# Patient Record
Sex: Female | Born: 1940 | Race: White | Hispanic: No | Smoking: Never smoker
Health system: Southern US, Community
[De-identification: ages and names within clinical notes are randomized; demographics above are authoritative.]

## PROBLEM LIST (undated history)

## (undated) DIAGNOSIS — E119 Type 2 diabetes mellitus without complications: Secondary | ICD-10-CM

## (undated) DIAGNOSIS — E785 Hyperlipidemia, unspecified: Secondary | ICD-10-CM

## (undated) DIAGNOSIS — I10 Essential (primary) hypertension: Secondary | ICD-10-CM

## (undated) HISTORY — DX: Type 2 diabetes mellitus without complications: E11.9

## (undated) HISTORY — PX: ABDOMINAL HYSTERECTOMY: SHX81

## (undated) HISTORY — DX: Hyperlipidemia, unspecified: E78.5

## (undated) HISTORY — PX: COLONOSCOPY: SHX174

## (undated) HISTORY — DX: Essential (primary) hypertension: I10

## (undated) HISTORY — PX: TONSILLECTOMY: SUR1361

---

## 1998-05-14 HISTORY — PX: OTHER SURGICAL HISTORY: SHX169

## 2007-10-16 ENCOUNTER — Ambulatory Visit: Payer: Self-pay | Admitting: Internal Medicine

## 2012-07-22 ENCOUNTER — Ambulatory Visit: Payer: Self-pay | Admitting: Internal Medicine

## 2012-08-04 ENCOUNTER — Ambulatory Visit: Payer: Self-pay | Admitting: Internal Medicine

## 2013-12-30 ENCOUNTER — Ambulatory Visit: Payer: Self-pay | Admitting: Internal Medicine

## 2014-03-01 ENCOUNTER — Encounter: Payer: Self-pay | Admitting: General Surgery

## 2014-03-07 ENCOUNTER — Emergency Department: Payer: Self-pay | Admitting: Emergency Medicine

## 2014-03-15 ENCOUNTER — Encounter: Payer: Self-pay | Admitting: General Surgery

## 2014-03-15 ENCOUNTER — Ambulatory Visit (INDEPENDENT_AMBULATORY_CARE_PROVIDER_SITE_OTHER): Payer: Medicare Other | Admitting: General Surgery

## 2014-03-15 VITALS — BP 130/74 | HR 72 | Resp 14 | Ht 65.0 in | Wt 216.0 lb

## 2014-03-15 DIAGNOSIS — Z1211 Encounter for screening for malignant neoplasm of colon: Secondary | ICD-10-CM

## 2014-03-15 NOTE — Progress Notes (Addendum)
Patient ID: Carolyn Gillespie, female   DOB: May 02, 1941, 73 y.o.   MRN: [id number]  Chief Complaint  Patient presents with  . Other    screening colonoscopy    HPI Carolyn Gillespie is a 73 y.o. female here today for a evalution of a colonoscopy . Patient states she is having no GI problems at this time. Patient states she had a colonoscopy 20 years ago. No family history of colon cancers.  HPI  Past Medical History  Diagnosis Date  . Diabetes mellitus without complication   . Hypertension   . Hyperlipidemia     Past Surgical History  Procedure Laterality Date  . Tonsillectomy    . Bladder tack  2000  . Colonoscopy      20 years ago  . Abdominal hysterectomy      1970's    No family history on file.  Social History History  Substance Use Topics  . Smoking status: Never Smoker   . Smokeless tobacco: Not on file  . Alcohol Use: No    No Known Allergies  Current Outpatient Prescriptions  Medication Sig Dispense Refill  . amLODipine (NORVASC) 5 MG tablet Take 5 mg by mouth daily.     Marland Kitchen glimepiride (AMARYL) 2 MG tablet Take 2 mg by mouth daily with breakfast.     . ibuprofen (ADVIL,MOTRIN) 800 MG tablet 800 mg every 8 (eight) hours as needed.     . Liraglutide (VICTOZA) 18 MG/3ML SOPN Inject 1.2 mg into the skin 1 day or 1 dose.    Marland Kitchen LORazepam (ATIVAN) 0.5 MG tablet Take 0.5 mg by mouth 2 (two) times daily.     . metFORMIN (GLUCOPHAGE) 1000 MG tablet Take 1,000 mg by mouth 2 (two) times daily with a meal.     . pioglitazone (ACTOS) 30 MG tablet Take 30 mg by mouth daily.    . pravastatin (PRAVACHOL) 40 MG tablet Take 40 mg by mouth daily.     . traMADol (ULTRAM) 50 MG tablet Take 50 mg by mouth every 12 (twelve) hours as needed.   0  . polyethylene glycol powder (MIRALAX) powder Take 1 Container by mouth once. 255 grams one bottle for colonoscopy prep     No current facility-administered medications for this visit.    Review of Systems Review of Systems  Constitutional:  Negative.   Respiratory: Negative.   Cardiovascular: Negative.     Blood pressure 130/74, pulse 72, resp. rate 14, height 5\' 5"  (1.651 m), weight 216 lb (97.977 kg).  Physical Exam Physical Exam  Constitutional: She is oriented to person, place, and time. She appears well-developed and well-nourished.  Cardiovascular: Normal rate, regular rhythm and normal heart sounds.   Pulmonary/Chest: Effort normal and breath sounds normal.  Neurological: She is alert and oriented to person, place, and time.  Skin: Skin is dry.    Data Reviewed None  Assessment    Candidate for screening colonoscopy.      Plan    Discussed colonoscopy risks and benefits.  Bleeding and perforation reviewed.     Patient has been scheduled for a colonoscopy on 03-30-14 at [facility]. Patient has been asked to hold Metformin, Actos, and glimepiride the day before procedure.   PCP:  Irven Easterly 03/17/2014, 7:56 AM

## 2014-03-15 NOTE — Patient Instructions (Addendum)
Colonoscopy A colonoscopy is an exam to look at the entire large intestine (colon). This exam can help find problems such as tumors, polyps, inflammation, and areas of bleeding. The exam takes about 1 hour.  LET [facility] CARE PROVIDER KNOW ABOUT:   Any allergies you have.  All medicines you are taking, including vitamins, herbs, eye drops, creams, and over-the-counter medicines.  Previous problems you or members of your family have had with the use of anesthetics.  Any blood disorders you have.  Previous surgeries you have had.  Medical conditions you have. RISKS AND COMPLICATIONS  Generally, this is a safe procedure. However, as with any procedure, complications can occur. Possible complications include:  Bleeding.  Tearing or rupture of the colon wall.  Reaction to medicines given during the exam.  Infection (rare). BEFORE THE PROCEDURE   Ask your health care provider about changing or stopping your regular medicines.  You may be prescribed an oral bowel prep. This involves drinking a large amount of medicated liquid, starting the day before your procedure. The liquid will cause you to have multiple loose stools until your stool is almost clear or light green. This cleans out your colon in preparation for the procedure.  Do not eat or drink anything else once you have started the bowel prep, unless your health care provider tells you it is safe to do so.  Arrange for someone to drive you home after the procedure. PROCEDURE   You will be given medicine to help you relax (sedative).  You will lie on your side with your knees bent.  A long, flexible tube with a light and camera on the end (colonoscope) will be inserted through the rectum and into the colon. The camera sends video back to a computer screen as it moves through the colon. The colonoscope also releases carbon dioxide gas to inflate the colon. This helps your health care provider see the area better.  During  the exam, your health care provider may take a small tissue sample (biopsy) to be examined under a microscope if any abnormalities are found.  The exam is finished when the entire colon has been viewed. AFTER THE PROCEDURE   Do not drive for 24 hours after the exam.  You may have a small amount of blood in your stool.  You may pass moderate amounts of gas and have mild abdominal cramping or bloating. This is caused by the gas used to inflate your colon during the exam.  Ask when your test results will be ready and how you will get your results. Make sure you get your test results. Document Released: 04/27/2000 Document Revised: 02/18/2013 Document Reviewed: 01/05/2013 [facility] Patient Information 2015 [place], [place]. This information is not intended to replace advice given to you by your health care provider. Make sure you discuss any questions you have with your health care provider.  Patient has been scheduled for a colonoscopy on 03-30-14 at [facility]. Patient has been asked to hold Metformin, Actos, and glimepiride the day before procedure.

## 2014-03-16 ENCOUNTER — Other Ambulatory Visit: Payer: Self-pay | Admitting: General Surgery

## 2014-03-16 DIAGNOSIS — Z1211 Encounter for screening for malignant neoplasm of colon: Secondary | ICD-10-CM | POA: Insufficient documentation

## 2014-03-30 ENCOUNTER — Ambulatory Visit: Payer: Self-pay | Admitting: General Surgery

## 2014-03-30 DIAGNOSIS — D122 Benign neoplasm of ascending colon: Secondary | ICD-10-CM

## 2014-03-30 DIAGNOSIS — Z1211 Encounter for screening for malignant neoplasm of colon: Secondary | ICD-10-CM

## 2014-03-31 ENCOUNTER — Encounter: Payer: Self-pay | Admitting: General Surgery

## 2014-04-01 ENCOUNTER — Telehealth: Payer: Self-pay

## 2014-04-01 ENCOUNTER — Encounter: Payer: Self-pay | Admitting: General Surgery

## 2014-04-01 NOTE — Telephone Encounter (Signed)
-----   Message from Robert Bellow, MD sent at 04/01/2014  3:16 PM EST ----- Please notify the patient that the polyp removed on November 17 was benign, no evidence of cancer. He is a kind that can have brothers and sisters come and a follow-up exam in 3 years would be appropriate. Please put her in recalls. Thank you  ----- Message -----    From: Darrin Nipper, CMA    Sent: 04/01/2014  11:51 AM      To: Robert Bellow, MD

## 2014-04-01 NOTE — Telephone Encounter (Signed)
Notified patient as instructed, patient pleased. Discussed follow-up appointments, patient agrees  

## 2014-09-06 LAB — SURGICAL PATHOLOGY

## 2015-11-21 ENCOUNTER — Other Ambulatory Visit: Payer: Self-pay | Admitting: Internal Medicine

## 2015-11-21 DIAGNOSIS — Z1231 Encounter for screening mammogram for malignant neoplasm of breast: Secondary | ICD-10-CM

## 2016-01-09 ENCOUNTER — Ambulatory Visit: Payer: Self-pay

## 2016-01-17 ENCOUNTER — Other Ambulatory Visit: Payer: Self-pay | Admitting: Internal Medicine

## 2016-01-17 ENCOUNTER — Ambulatory Visit
Admission: RE | Admit: 2016-01-17 | Discharge: 2016-01-17 | Disposition: A | Discharge status: Home / Self Care | Payer: Medicare Other | Source: Ambulatory Visit | Attending: Internal Medicine | Admitting: Internal Medicine

## 2016-01-17 DIAGNOSIS — Z1231 Encounter for screening mammogram for malignant neoplasm of breast: Secondary | ICD-10-CM | POA: Insufficient documentation

## 2017-03-20 ENCOUNTER — Encounter: Payer: Self-pay | Admitting: *Deleted

## 2017-03-25 ENCOUNTER — Ambulatory Visit: Payer: Self-pay | Admitting: General Surgery

## 2017-06-06 ENCOUNTER — Ambulatory Visit
Admission: RE | Admit: 2017-06-06 | Discharge: 2017-06-06 | Disposition: A | Discharge status: Home / Self Care | Payer: Medicare Other | Source: Ambulatory Visit | Attending: Internal Medicine | Admitting: Internal Medicine

## 2017-06-06 ENCOUNTER — Other Ambulatory Visit: Payer: Self-pay | Admitting: Internal Medicine

## 2017-06-06 DIAGNOSIS — R05 Cough: Secondary | ICD-10-CM

## 2017-06-06 DIAGNOSIS — R059 Cough, unspecified: Secondary | ICD-10-CM

## 2017-08-23 ENCOUNTER — Encounter: Payer: Self-pay | Admitting: *Deleted

## 2017-10-10 ENCOUNTER — Encounter: Payer: Self-pay | Admitting: *Deleted

## 2017-10-17 ENCOUNTER — Ambulatory Visit: Payer: Medicare Other | Admitting: General Surgery

## 2018-04-17 ENCOUNTER — Ambulatory Visit: Payer: Medicare Other | Admitting: General Surgery

## 2018-09-02 ENCOUNTER — Ambulatory Visit: Payer: Medicare Other | Admitting: General Surgery

## 2018-11-06 ENCOUNTER — Ambulatory Visit: Payer: Medicare Other | Admitting: General Surgery

## 2018-11-13 ENCOUNTER — Ambulatory Visit: Payer: Medicare Other | Admitting: General Surgery

## 2018-11-20 ENCOUNTER — Encounter: Payer: Self-pay | Admitting: General Surgery

## 2018-11-20 ENCOUNTER — Ambulatory Visit (INDEPENDENT_AMBULATORY_CARE_PROVIDER_SITE_OTHER): Payer: Medicare Other | Admitting: General Surgery

## 2018-11-20 ENCOUNTER — Other Ambulatory Visit: Payer: Self-pay

## 2018-11-20 VITALS — BP 152/80 | HR 96 | Temp 97.2°F | Resp 16 | Ht 65.0 in | Wt 218.8 lb

## 2018-11-20 DIAGNOSIS — Z86010 Personal history of colonic polyps: Secondary | ICD-10-CM

## 2018-11-20 MED ORDER — POLYETHYLENE GLYCOL 3350 17 GM/SCOOP PO POWD: ORAL | 0 refills | Status: DC

## 2018-11-20 NOTE — Patient Instructions (Addendum)
We will call you to schedule the Colonoscopy.     Colonoscopy, Adult A colonoscopy is an exam to look at the entire large intestine. During the exam, a lubricated, flexible tube that has a camera on the end of it is inserted into the anus and then passed into the rectum, colon, and other parts of the large intestine. You may have a colonoscopy as a part of normal colorectal screening or if you have certain symptoms, such as:  Lack of red blood cells (anemia).  Diarrhea that does not go away.  Abdominal pain.  Blood in your stool (feces). A colonoscopy can help screen for and diagnose medical problems, including:  Tumors.  Polyps.  Inflammation.  Areas of bleeding. Tell a health care provider about:  Any allergies you have.  All medicines you are taking, including vitamins, herbs, eye drops, creams, and over-the-counter medicines.  Any problems you or family members have had with anesthetic medicines.  Any blood disorders you have.  Any surgeries you have had.  Any medical conditions you have.  Any problems you have had passing stool. What are the risks? Generally, this is a safe procedure. However, problems may occur, including:  Bleeding.  A tear in the intestine.  A reaction to medicines given during the exam.  Infection (rare). What happens before the procedure? Eating and drinking restrictions Follow instructions from your health care provider about eating and drinking, which may include:  A few days before the procedure - follow a low-fiber diet. Avoid nuts, seeds, dried fruit, raw fruits, and vegetables.  1-3 days before the procedure - follow a clear liquid diet. Drink only clear liquids, such as clear broth or bouillon, black coffee or tea, clear juice, clear soft drinks or sports drinks, gelatin dessert, and popsicles. Avoid any liquids that contain red or purple dye.  On the day of the procedure - do not eat or drink anything starting 2 hours before  the procedure, or within the time period that your health care provider recommends. Up to 2 hours before the procedure, you may continue to drink clear liquids, such as water or clear fruit juice. Bowel prep If you were prescribed an oral bowel prep to clean out your colon:  Take it as told by your health care provider. Starting the day before your procedure, you will need to drink a large amount of medicated liquid. The liquid will cause you to have multiple loose stools until your stool is almost clear or light green.  If your skin or anus gets irritated from diarrhea, you may use these to relieve the irritation: ? Medicated wipes, such as adult wet wipes with aloe and vitamin E. ? A skin-soothing product like petroleum jelly.  If you vomit while drinking the bowel prep, take a break for up to 60 minutes and then begin the bowel prep again. If vomiting continues and you cannot take the bowel prep without vomiting, call your health care provider.  To clean out your colon, you may also be given: ? Laxative medicines. ? Instructions about how to use an enema. General instructions  Ask your health care provider about: ? Changing or stopping your regular medicines or supplements. This is especially important if you are taking iron supplements, diabetes medicines, or blood thinners. ? Taking medicines such as aspirin and ibuprofen. These medicines can thin your blood. Do not take these medicines before the procedure if your health care provider tells you not to.  Plan to have someone take  you home from the hospital or clinic. What happens during the procedure?   An IV may be inserted into one of your veins.  You will be given medicine to help you relax (sedative).  To reduce your risk of infection: ? Your health care team will wash or sanitize their hands. ? Your anal area will be washed with soap.  You will be asked to lie on your side with your knees bent.  Your health care provider  will lubricate a long, thin, flexible tube. The tube will have a camera and a light on the end.  The tube will be inserted into your anus.  The tube will be gently eased through your rectum and colon.  Air will be delivered into your colon to keep it open. You may feel some pressure or cramping.  The camera will be used to take images during the procedure.  A small tissue sample may be removed to be examined under a microscope (biopsy).  If small polyps are found, your health care provider may remove them and have them checked for cancer cells.  When the exam is done, the tube will be removed. The procedure may vary among health care providers and hospitals. What happens after the procedure?  Your blood pressure, heart rate, breathing rate, and blood oxygen level will be monitored until the medicines you were given have worn off.  Do not drive for 24 hours after the exam.  You may have a small amount of blood in your stool.  You may pass gas and have mild abdominal cramping or bloating due to the air that was used to inflate your colon during the exam.  It is up to you to get the results of your procedure. Ask your health care provider, or the department performing the procedure, when your results will be ready. Summary  A colonoscopy is an exam to look at the entire large intestine.  During a colonoscopy, a lubricated, flexible tube with a camera on the end of it is inserted into the anus and then passed into the colon and other parts of the large intestine.  Follow instructions from your health care provider about eating and drinking before the procedure.  If you were prescribed an oral bowel prep to clean out your colon, take it as told by your health care provider.  After your procedure, your blood pressure, heart rate, breathing rate, and blood oxygen level will be monitored until the medicines you were given have worn off. This information is not intended to replace advice  given to you by your health care provider. Make sure you discuss any questions you have with your health care provider. Document Released: 04/27/2000 Document Revised: 02/20/2017 Document Reviewed: 07/12/2015 Elsevier Patient Education  2020 [employer].

## 2018-11-20 NOTE — Progress Notes (Addendum)
Patient ID: Carolyn Gillespie, female   DOB: 1940/12/22, 78 y.o.   MRN: [id number]  Chief Complaint  Patient presents with  . Follow-up    Colonoscopy discussion    HPI Carolyn Gillespie is a 78 y.o. female.  Here today for Colonoscopy discussion. Last done 2015. Husband recently passed away. No blood in stool. Normal bowel movements.  HPI  Past Medical History:  Diagnosis Date  . Diabetes mellitus without complication ([place])   . Hyperlipidemia   . Hypertension     Past Surgical History:  Procedure Laterality Date  . ABDOMINAL HYSTERECTOMY     1970's  . bladder tack  2000  . COLONOSCOPY     20 years ago  . TONSILLECTOMY      History reviewed. No pertinent family history.  Social History Social History   Tobacco Use  . Smoking status: Never Smoker  . Smokeless tobacco: Never Used  Substance Use Topics  . Alcohol use: No    Alcohol/week: 0.0 standard drinks  . Drug use: No    No Known Allergies  Current Outpatient Medications  Medication Sig Dispense Refill  . amLODipine (NORVASC) 10 MG tablet Take 10 mg by mouth daily.    Marland Kitchen glimepiride (AMARYL) 2 MG tablet Take 2 mg by mouth daily with breakfast.     . ibuprofen (ADVIL,MOTRIN) 800 MG tablet 800 mg every 8 (eight) hours as needed.     . Liraglutide (VICTOZA) 18 MG/3ML SOPN Inject 1.2 mg into the skin 1 day or 1 dose.    Marland Kitchen LORazepam (ATIVAN) 0.5 MG tablet Take 0.5 mg by mouth 2 (two) times daily.     . metFORMIN (GLUCOPHAGE) 1000 MG tablet Take 1,000 mg by mouth 2 (two) times daily with a meal.     . pioglitazone (ACTOS) 30 MG tablet Take 30 mg by mouth daily.    . polyethylene glycol powder (MIRALAX) powder Take 1 Container by mouth once. 255 grams one bottle for colonoscopy prep    . pravastatin (PRAVACHOL) 40 MG tablet Take 40 mg by mouth daily.     . polyethylene glycol powder (GLYCOLAX/MIRALAX) 17 GM/SCOOP powder 255 grams one bottle for colonoscopy prep 255 g 0   No current facility-administered medications for  this visit.     Review of Systems Review of Systems  Constitutional: Negative.   Respiratory: Negative.   Cardiovascular: Negative.     Blood pressure (!) 152/80, pulse 96, temperature (!) 97.2 F (36.2 C), temperature source Temporal, resp. rate 16, height 5\' 5"  (1.651 m), weight 218 lb 12.8 oz (99.2 kg), SpO2 96 %.  Physical Exam Physical Exam Constitutional:      Appearance: Normal appearance.  Cardiovascular:     Rate and Rhythm: Normal rate and regular rhythm.  Pulmonary:     Effort: Pulmonary effort is normal.     Breath sounds: Normal breath sounds.  Skin:    General: Skin is warm and dry.  Neurological:     Mental Status: She is alert.     Data Reviewed March 30, 2014 colonoscopy:  DIAGNOSIS:  A. ASCENDING COLON POLYP; HOT SNARE:  - VILLOUS ADENOMA, MULTIPLE FRAGMENTS.  - NEGATIVE FOR HIGH-GRADE DYSPLASIA AND MALIGNANCY.   Assessment Candidate for repeat colonoscopy.  Plan Colonoscopy with possible biopsy/polypectomy prn: Information regarding the procedure, including its potential risks and complications (including but not limited to perforation of the bowel, which may require emergency surgery to repair, and bleeding) was verbally given to the patient. Educational information regarding  lower intestinal endoscopy was given to the patient. Written instructions for how to complete the bowel prep using Miralax were provided. The importance of drinking ample fluids to avoid dehydration as a result of the prep emphasized.   HPI, Physical Exam, Assessment and Plan have been scribed under the direction and in the presence of Robert Bellow, MD. Jonnie Finner, CMA  I have completed the exam and reviewed the above documentation for accuracy and completeness.  I agree with the above.  Haematologist has been used and any errors in dictation or transcription are unintentional.  Hervey Ard, M.D., F.A.C.S.  Forest Gleason Yuli Lanigan 11/20/2018, 7:50  PM   Patient has been scheduled for a colonoscopy on 12-17-18 at [facility]. Miralax prescription has been sent in to the patient's pharmacy today. Colonoscopy instructions have been reviewed with the patient. This patient has been asked to hold metformin day of colonoscopy prep and procedure. This patient is aware to call the office if they have further questions.   Dominga Ferry, CMA

## 2018-12-02 ENCOUNTER — Telehealth: Payer: Self-pay | Admitting: *Deleted

## 2018-12-02 NOTE — Telephone Encounter (Signed)
Patient was contacted today and notified that Dr. Bary Castilla is no longer with the practice.   I did inform patient that none of the other doctors at the office do colonoscopies. I did offer to make a referral to [facility] or the office of her choosing. Patient declines referral to [facility].  Patient states she liked Dr. Bary Castilla and doesn't know who she would go to.   I did encourage patient to address this with her primary care physician to see who he would recommend. Patient verbalizes understanding.

## 2018-12-12 ENCOUNTER — Other Ambulatory Visit: Payer: Medicare Other

## 2018-12-17 ENCOUNTER — Ambulatory Visit: Admit: 2018-12-17 | Payer: Medicare Other | Admitting: General Surgery

## 2018-12-17 SURGERY — COLONOSCOPY WITH PROPOFOL
Anesthesia: General

## 2018-12-19 ENCOUNTER — Other Ambulatory Visit: Payer: Self-pay | Admitting: Internal Medicine

## 2018-12-19 DIAGNOSIS — Z1231 Encounter for screening mammogram for malignant neoplasm of breast: Secondary | ICD-10-CM

## 2018-12-27 ENCOUNTER — Ambulatory Visit
Admission: EM | Admit: 2018-12-27 | Discharge: 2018-12-27 | Disposition: A | Discharge status: Home / Self Care | Payer: Medicare Other | Attending: Family Medicine | Admitting: Family Medicine

## 2018-12-27 ENCOUNTER — Encounter: Payer: Self-pay | Admitting: Emergency Medicine

## 2018-12-27 ENCOUNTER — Other Ambulatory Visit: Payer: Self-pay

## 2018-12-27 DIAGNOSIS — H1032 Unspecified acute conjunctivitis, left eye: Secondary | ICD-10-CM

## 2018-12-27 MED ORDER — MOXIFLOXACIN HCL 0.5 % OP SOLN: 1.0000 [drp] | Freq: Three times a day (TID) | OPHTHALMIC | 0 refills | Status: DC

## 2018-12-27 NOTE — ED Triage Notes (Signed)
Patient c/o redness, swelling and tenderness in her left eye that started yesterday.  Patient denies fevers.

## 2018-12-27 NOTE — Discharge Instructions (Signed)
Cool compresses to area Follow up with your eye doctor next week if symptoms not improving

## 2018-12-27 NOTE — ED Provider Notes (Signed)
[facility]    CSN: [id number] Arrival date & time: 12/27/18  0856     History   Chief Complaint Chief Complaint  Patient presents with  . Eye Problem    left  APPOINTMENT    HPI Carolyn Gillespie is a 78 y.o. female.   78 yo female with a c/o redness and drainage from her left eye since yesterday. States she woke up this morning with eye matted shut. Denies any injuries, foreign body sensation, fevers, chills.    Eye Problem   Past Medical History:  Diagnosis Date  . Diabetes mellitus without complication ([place])   . Hyperlipidemia   . Hypertension     Patient Active Problem List   Diagnosis Date Noted  . History of colonic polyps 11/20/2018    Past Surgical History:  Procedure Laterality Date  . ABDOMINAL HYSTERECTOMY     1970's  . bladder tack  2000  . COLONOSCOPY     20 years ago  . TONSILLECTOMY      OB History    Gravida  2   Para  2   Term      Preterm      AB      Living  2     SAB      TAB      Ectopic      Multiple      Live Births           Obstetric Comments  1st Menstrual Cycle:  15  1st Pregnancy:  19         Home Medications    Prior to Admission medications   Medication Sig Start Date End Date Taking? Authorizing Provider  amLODipine (NORVASC) 10 MG tablet Take 10 mg by mouth daily.   Yes [provider]  glimepiride (AMARYL) 2 MG tablet Take 2 mg by mouth daily with breakfast.  02/03/14  Yes [provider]  Liraglutide (VICTOZA) 18 MG/3ML SOPN Inject 1.2 mg into the skin 1 day or 1 dose.   Yes [provider]  LORazepam (ATIVAN) 0.5 MG tablet Take 0.5 mg by mouth 2 (two) times daily.  02/03/14  Yes [provider]  metFORMIN (GLUCOPHAGE) 1000 MG tablet Take 1,000 mg by mouth 2 (two) times daily with a meal.  02/03/14  Yes [provider]  pioglitazone (ACTOS) 30 MG tablet Take 30 mg by mouth daily.   Yes [provider]  polyethylene glycol powder  (MIRALAX) powder Take 1 Container by mouth once. 255 grams one bottle for colonoscopy prep   Yes [provider]  pravastatin (PRAVACHOL) 40 MG tablet Take 40 mg by mouth daily.  02/03/14  Yes [provider]  ibuprofen (ADVIL,MOTRIN) 800 MG tablet 800 mg every 8 (eight) hours as needed.  02/03/14   [provider]  moxifloxacin (VIGAMOX) 0.5 % ophthalmic solution Place 1 drop into the left eye 3 (three) times daily. 12/27/18   Norval Gable, MD  polyethylene glycol powder (GLYCOLAX/MIRALAX) 17 GM/SCOOP powder 255 grams one bottle for colonoscopy prep 11/20/18   Byrnett, Forest Gleason, MD    Family History Family History  Problem Relation Age of Onset  . Diabetes Mother     Social History Social History   Tobacco Use  . Smoking status: Never Smoker  . Smokeless tobacco: Never Used  Substance Use Topics  . Alcohol use: No    Alcohol/week: 0.0 standard drinks  . Drug use: No  Allergies   Patient has no known allergies.   Review of Systems Review of Systems   Physical Exam Triage Vital Signs ED Triage Vitals  Enc Vitals Group     BP 12/27/18 0913 (!) 173/75     Pulse Rate 12/27/18 0913 91     Resp 12/27/18 0913 16     Temp 12/27/18 0913 98.3 F (36.8 C)     Temp Source 12/27/18 0913 Oral     SpO2 12/27/18 0913 98 %     Weight 12/27/18 0909 214 lb (97.1 kg)     Height 12/27/18 0909 5\' 5"  (1.651 m)     Head Circumference --      Peak Flow --      Pain Score 12/27/18 0909 5     Pain Loc --      Pain Edu? --      Excl. in [place]? --    No data found.  Updated Vital Signs BP (!) 168/98 (BP Location: Right Arm)   Pulse 91   Temp 98.3 F (36.8 C) (Oral)   Resp 16   Ht 5\' 5"  (1.651 m)   Wt 97.1 kg   SpO2 98%   BMI 35.61 kg/m   Visual Acuity Right Eye Distance: 20/25 corrected Left Eye Distance: 20/25 corrected Bilateral Distance: 20/25 corrected  Right Eye Near:   Left Eye Near:    Bilateral Near:     Physical Exam Vitals signs and  nursing note reviewed.  Constitutional:      General: She is not in acute distress.    Appearance: She is not toxic-appearing or diaphoretic.  Eyes:     General: Lids are normal.        Right eye: No foreign body.        Left eye: No foreign body.     Conjunctiva/sclera:     Left eye: Left conjunctiva is injected. Exudate present.  Neurological:     Mental Status: She is alert.      UC Treatments / Results  Labs (all labs ordered are listed, but only abnormal results are displayed) Labs Reviewed - No data to display  EKG   Radiology No results found.  Procedures Procedures (including critical care time)  Medications Ordered in UC Medications - No data to display  Initial Impression / Assessment and Plan / UC Course  I have reviewed the triage vital signs and the nursing notes.  Pertinent labs & imaging results that were available during my care of the patient were reviewed by me and considered in my medical decision making (see chart for details).      Final Clinical Impressions(s) / UC Diagnoses   Final diagnoses:  Acute conjunctivitis of left eye, unspecified acute conjunctivitis type     Discharge Instructions     Cool compresses to area Follow up with your eye doctor next week if symptoms not improving    ED Prescriptions    Medication Sig Dispense Auth. Provider   moxifloxacin (VIGAMOX) 0.5 % ophthalmic solution Place 1 drop into the left eye 3 (three) times daily. 3 mL Norval Gable, MD      1. diagnosis reviewed with patient 2. rx as per orders above; reviewed possible side effects, interactions, risks and benefits  3. Recommend supportive treatment as above 4. Follow-up prn if symptoms worsen or don't improve  Controlled Substance Prescriptions [place] Controlled Substance Registry consulted? Not Applicable   Norval Gable, MD 12/27/18 1407

## 2019-01-27 ENCOUNTER — Ambulatory Visit
Admission: RE | Admit: 2019-01-27 | Discharge: 2019-01-27 | Disposition: A | Discharge status: Home / Self Care | Payer: Medicare Other | Source: Ambulatory Visit | Attending: Internal Medicine | Admitting: Internal Medicine

## 2019-01-27 DIAGNOSIS — Z1231 Encounter for screening mammogram for malignant neoplasm of breast: Secondary | ICD-10-CM

## 2020-02-25 ENCOUNTER — Other Ambulatory Visit: Payer: Self-pay | Admitting: Internal Medicine

## 2020-02-25 DIAGNOSIS — Z1231 Encounter for screening mammogram for malignant neoplasm of breast: Secondary | ICD-10-CM

## 2020-03-08 ENCOUNTER — Ambulatory Visit
Admission: RE | Admit: 2020-03-08 | Discharge: 2020-03-08 | Disposition: A | Discharge status: Home / Self Care | Payer: Medicare Other | Source: Ambulatory Visit | Attending: Internal Medicine | Admitting: Internal Medicine

## 2020-03-08 ENCOUNTER — Other Ambulatory Visit: Payer: Self-pay

## 2020-03-08 DIAGNOSIS — Z1231 Encounter for screening mammogram for malignant neoplasm of breast: Secondary | ICD-10-CM | POA: Diagnosis not present

## 2021-07-17 IMAGING — MG DIGITAL SCREENING BILAT W/ TOMO W/ CAD
6 of 10 series · 6 of 30 positions shown · non-contrast
Comparison: Previous exam(s).

CLINICAL DATA: Screening.

EXAM:
DIGITAL SCREENING BILATERAL MAMMOGRAM WITH TOMO AND CAD

[L CC synth-2D]
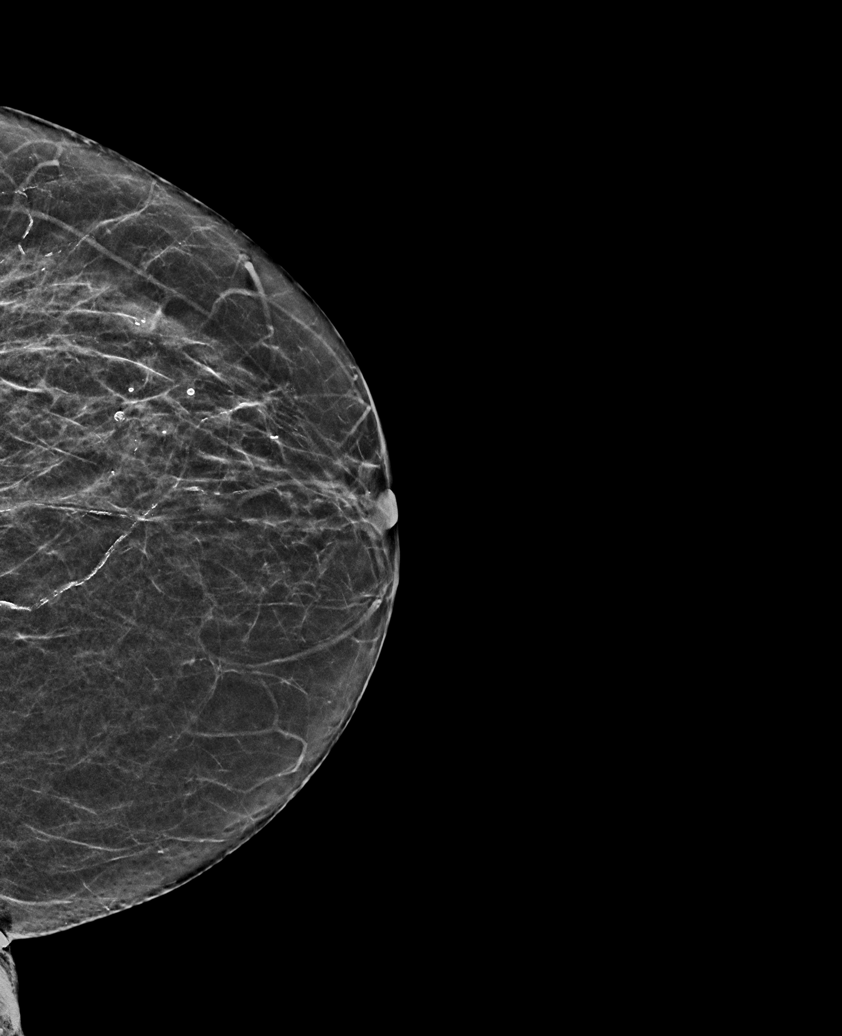

[R MLO synth-2D]
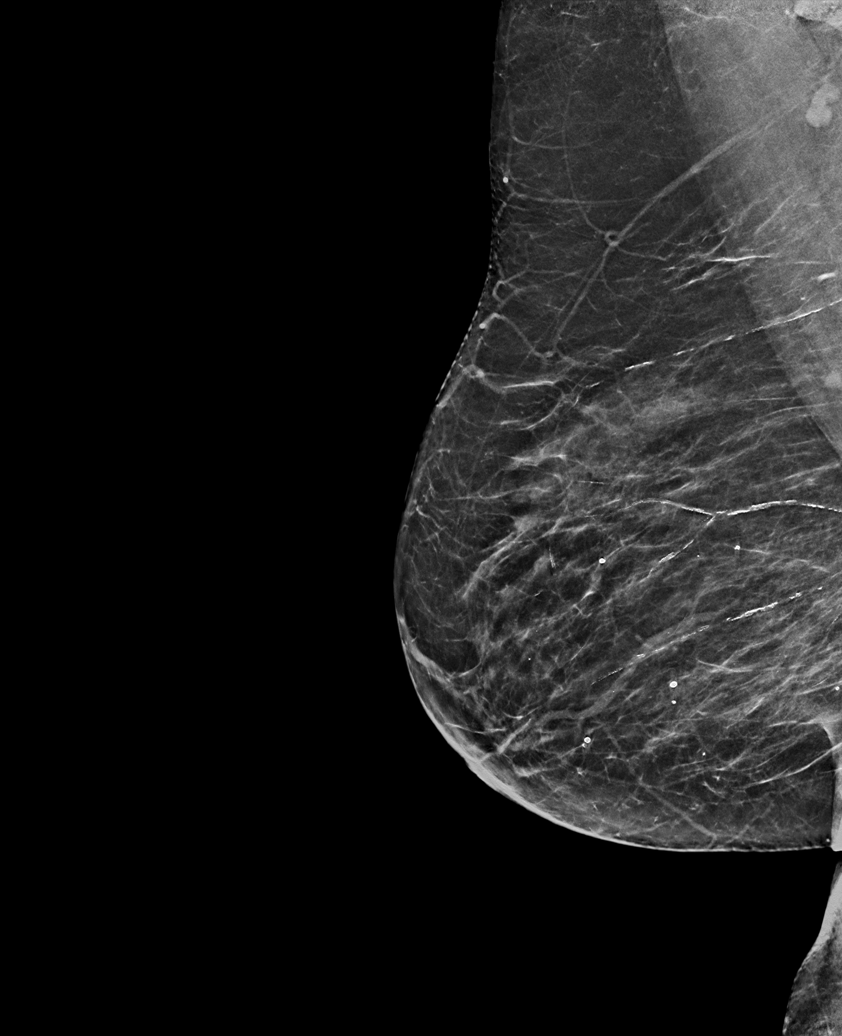

[L MLO synth-2D (1 of 2)]
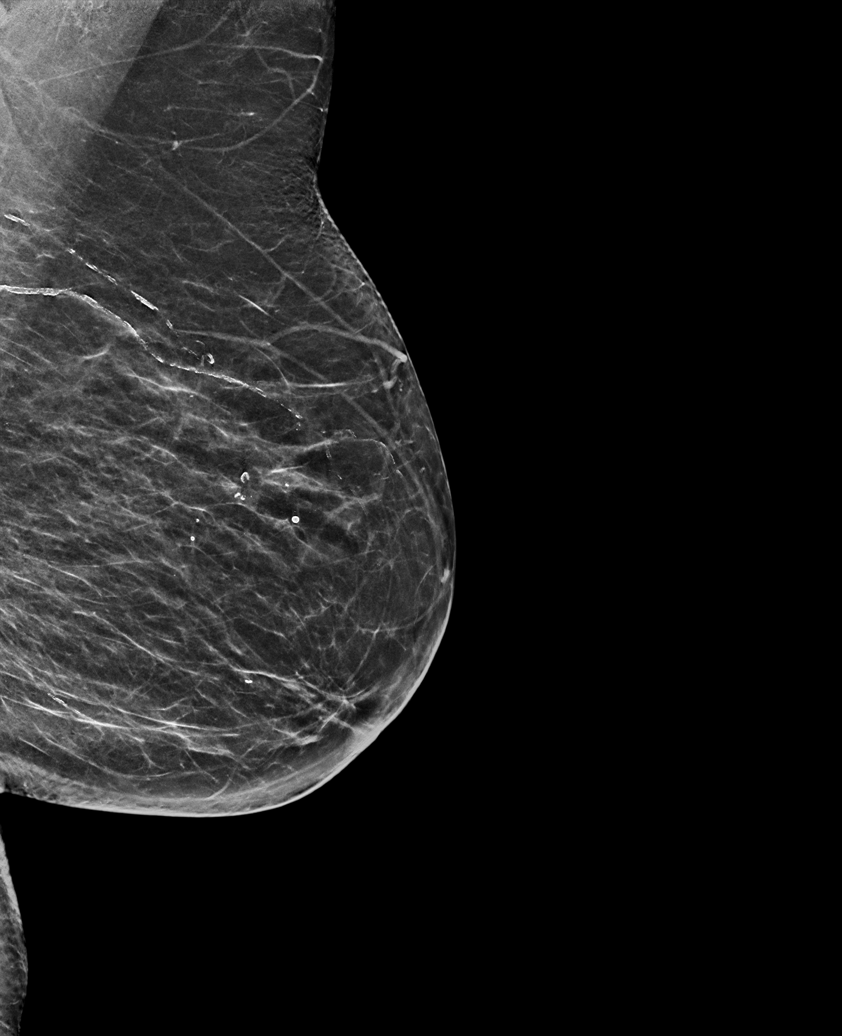

[L MLO synth-2D (2 of 2)]
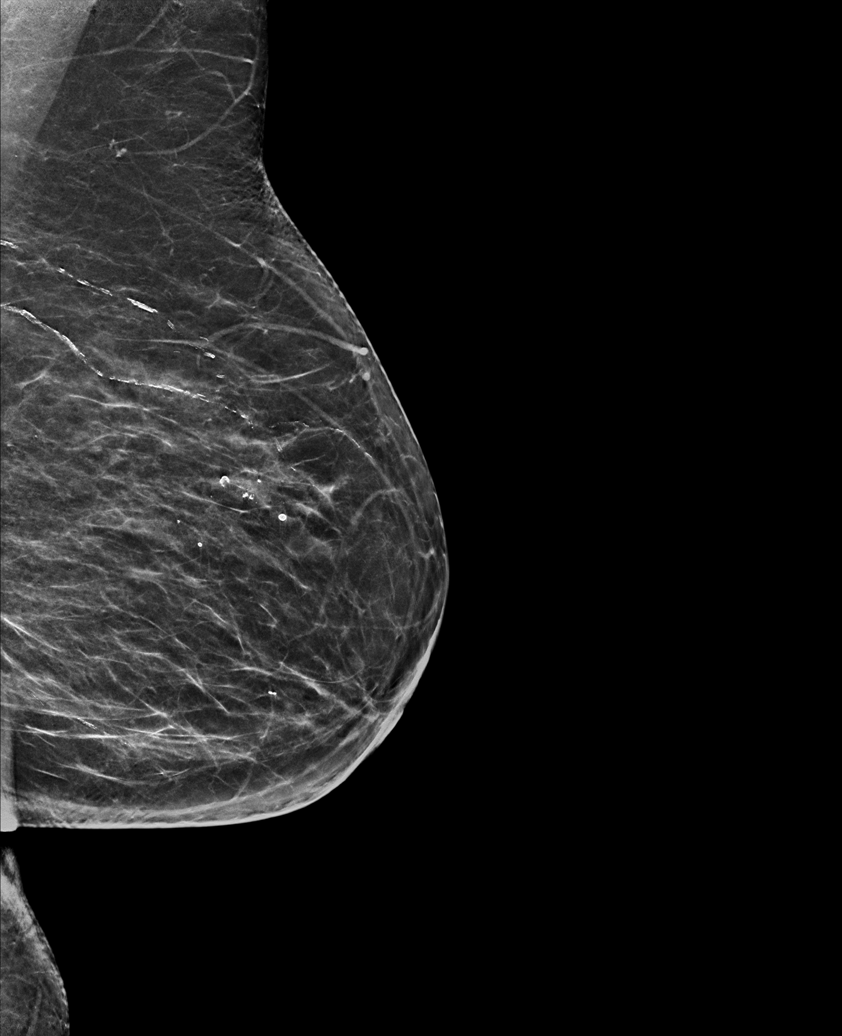

[R CC synth-2D]
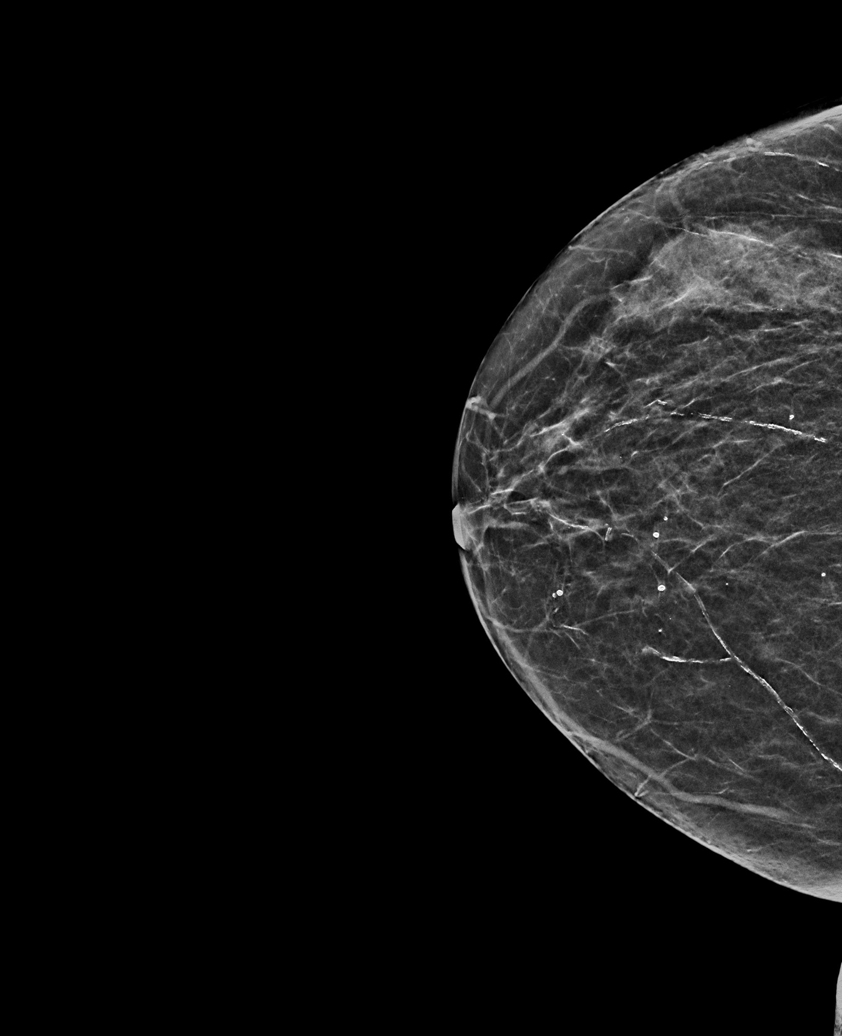

[L CC tomo · tomo slice 25/50.0]
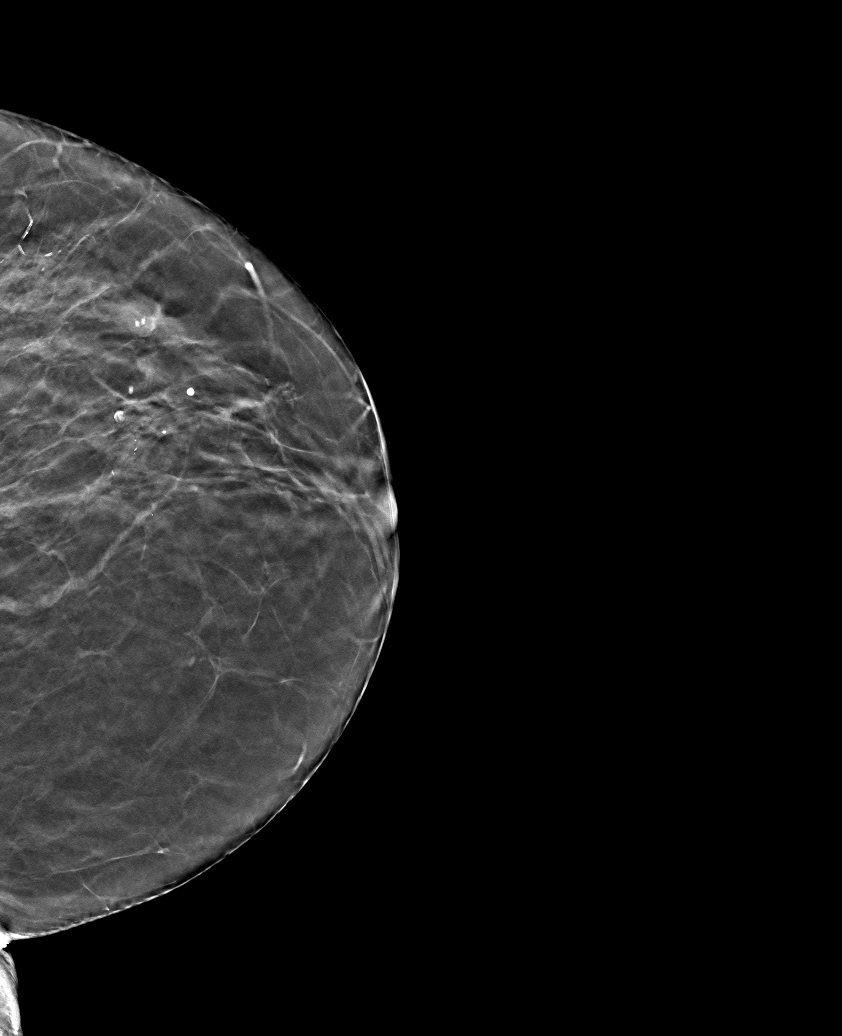

[6 of 30 positions shown; findings below may reference images not displayed]

ACR Breast Density Category b: There are scattered areas of
fibroglandular density.
FINDINGS: There are no findings suspicious for malignancy. Images were
processed with CAD.
IMPRESSION: No mammographic evidence of malignancy. A result letter of this
screening mammogram will be mailed directly to the patient.

RECOMMENDATION:
Screening mammogram in one year. (Code:[id number])

BI-RADS CATEGORY  1: Negative.

## 2021-09-07 ENCOUNTER — Ambulatory Visit
Admission: RE | Admit: 2021-09-07 | Discharge: 2021-09-07 | Disposition: A | Discharge status: Home / Self Care | Payer: Medicare Other | Source: Ambulatory Visit | Attending: Family | Admitting: Family

## 2021-09-07 VITALS — BP 171/75 | HR 77 | Temp 98.3°F | Resp 16 | Ht 65.0 in | Wt 214.1 lb

## 2021-09-07 DIAGNOSIS — R051 Acute cough: Secondary | ICD-10-CM | POA: Diagnosis not present

## 2021-09-07 DIAGNOSIS — J0140 Acute pansinusitis, unspecified: Secondary | ICD-10-CM | POA: Diagnosis not present

## 2021-09-07 DIAGNOSIS — H1033 Unspecified acute conjunctivitis, bilateral: Secondary | ICD-10-CM | POA: Diagnosis not present

## 2021-09-07 MED ORDER — AMOXICILLIN-POT CLAVULANATE 875-125 MG PO TABS: 1.0000 | ORAL_TABLET | Freq: Two times a day (BID) | ORAL | 0 refills | Status: AC

## 2021-09-07 MED ORDER — MOXIFLOXACIN HCL 0.5 % OP SOLN: 1.0000 [drp] | Freq: Three times a day (TID) | OPHTHALMIC | 0 refills | Status: AC

## 2021-09-07 NOTE — ED Triage Notes (Signed)
Pt c/o eye redness, swelling and drainage. Started about 2 days ago. Pt also c/o nasal congestion, cough, sputum production. Started about a week ago. Denies fever. Home covid test negative.  ?

## 2021-09-07 NOTE — ED Provider Notes (Signed)
?[place] ? ? ? ?CSN: [id number] ?Arrival date & time: 09/07/21  0807 ? ? ?  ? ?History   ?Chief Complaint ?Chief Complaint  ?Patient presents with  ? Eye Problem  ? Nasal Congestion  ? ? ?HPI ?Carolyn Gillespie is a 81 y.o. female.  ? ?81 year old female presents with nasal congestion, cough and sinus pressure for over 1 week. Was improving at first but now symptoms are worsening. Coughing up green mucus from sinus drainage. Denies any fever or GI symptoms. Took home COVID test which was negative. Also 2 days ago started experiencing bilateral eye redness and swelling of her eyelids with yellowish drainage. No pain but very irritated. Her grandchildren just had eye infection (probable pink eye) and other family members have had it the past few weeks. Has tried applying eye drops with minimal relief. Has also taken OTC cough medication for diabetics with minimal relief. Other chronic health issues include HTN, hyperlipidemia, thyroid disorder and type 2 DM. Currently on Norvasc, aspirin, Atorvastatin, Levothyroxine, Metformin, Actos, Glimepiride daily and Tulicity once weekly.  ? ?The history is provided by the patient.  ? ?Past Medical History:  ?Diagnosis Date  ? Diabetes mellitus without complication ([place])   ? Hyperlipidemia   ? Hypertension   ? ? ?Patient Active Problem List  ? Diagnosis Date Noted  ? History of colonic polyps 11/20/2018  ? ? ?Past Surgical History:  ?Procedure Laterality Date  ? ABDOMINAL HYSTERECTOMY    ? 1970's  ? bladder tack  2000  ? COLONOSCOPY    ? 20 years ago  ? TONSILLECTOMY    ? ? ?OB History   ? ? Gravida  ?2  ? Para  ?2  ? Term  ?   ? Preterm  ?   ? AB  ?   ? Living  ?2  ?  ? ? SAB  ?   ? IAB  ?   ? Ectopic  ?   ? Multiple  ?   ? Live Births  ?   ?   ?  ? Obstetric Comments  ?1st Menstrual Cycle:  15  ?1st Pregnancy:  19  ?  ? ?  ? ? ? ?Home Medications   ? ?Prior to Admission medications   ?Medication Sig Start Date End Date Taking? Authorizing Provider  ?amLODipine  (NORVASC) 10 MG tablet Take 10 mg by mouth daily.   Yes [provider]  ?amoxicillin-clavulanate (AUGMENTIN) 875-125 MG tablet Take 1 tablet by mouth every 12 (twelve) hours for 7 days. 09/07/21 09/14/21 Yes Freddi Schrager, Nicholes Stairs, NP  ?Dulaglutide (TRULICITY [place]) Inject into the skin.   Yes [provider]  ?glimepiride (AMARYL) 2 MG tablet Take 2 mg by mouth daily with breakfast.  02/03/14  Yes [provider]  ?ibuprofen (ADVIL,MOTRIN) 800 MG tablet 800 mg every 8 (eight) hours as needed.  02/03/14  Yes [provider]  ?LEVOTHYROXINE SODIUM PO Take by mouth.   Yes [provider]  ?LORazepam (ATIVAN) 0.5 MG tablet Take 0.5 mg by mouth 2 (two) times daily.  02/03/14  Yes [provider]  ?metFORMIN (GLUCOPHAGE) 1000 MG tablet Take 1,000 mg by mouth 2 (two) times daily with a meal.  02/03/14  Yes [provider]  ?pioglitazone (ACTOS) 30 MG tablet Take 30 mg by mouth daily.   Yes [provider]  ?polyethylene glycol powder (GLYCOLAX/MIRALAX) 17 GM/SCOOP powder Take 1 Container by mouth once. 255 grams one bottle for colonoscopy prep  Yes [provider]  ?pravastatin (PRAVACHOL) 40 MG tablet Take 40 mg by mouth daily.  02/03/14  Yes [provider]  ?moxifloxacin (VIGAMOX) 0.5 % ophthalmic solution Place 1 drop into both eyes 3 (three) times daily. 09/07/21   Katy Apo, NP  ? ? ?Family History ?Family History  ?Problem Relation Age of Onset  ? Diabetes Mother   ? Breast cancer Neg Hx   ? ? ?Social History ?Social History  ? ?Tobacco Use  ? Smoking status: Never  ? Smokeless tobacco: Never  ?Vaping Use  ? Vaping Use: Never used  ?Substance Use Topics  ? Alcohol use: No  ?  Alcohol/week: 0.0 standard drinks  ? Drug use: No  ? ? ? ?Allergies   ?Patient has no known allergies. ? ? ?Review of Systems ?Review of Systems  ?Constitutional:  Positive for fatigue. Negative for activity change, appetite change, chills, diaphoresis and fever.   ?HENT:  Positive for congestion, facial swelling (eyelids), postnasal drip, sinus pressure, sinus pain and sore throat (irritated). Negative for ear discharge, ear pain, mouth sores, rhinorrhea and trouble swallowing.   ?Eyes:  Positive for discharge and redness. Negative for photophobia and pain.  ?Respiratory:  Positive for cough. Negative for chest tightness, shortness of breath and wheezing.   ?Gastrointestinal:  Negative for diarrhea, nausea and vomiting.  ?Musculoskeletal:  Negative for arthralgias, myalgias, neck pain and neck stiffness.  ?Skin:  Negative for color change and rash.  ?Allergic/Immunologic: Negative for environmental allergies, food allergies and immunocompromised state.  ?Neurological:  Positive for headaches (frontal sinus). Negative for dizziness, tremors, seizures, syncope, speech difficulty, weakness, light-headedness and numbness.  ?Hematological:  Negative for adenopathy. Does not bruise/bleed easily.  ? ? ?Physical Exam ?Triage Vital Signs ?ED Triage Vitals  ?Enc Vitals Group  ?   BP 09/07/21 0820 (!) 171/75  ?   Pulse Rate 09/07/21 0820 77  ?   Resp 09/07/21 0820 16  ?   Temp 09/07/21 0820 98.3 ?F (36.8 ?C)  ?   Temp Source 09/07/21 0820 Oral  ?   SpO2 09/07/21 0820 99 %  ?   Weight 09/07/21 0818 214 lb 1.1 oz (97.1 kg)  ?   Height 09/07/21 0818 '5\' 5"'$  (1.651 m)  ?   Head Circumference --   ?   Peak Flow --   ?   Pain Score 09/07/21 0817 6  ?   Pain Loc --   ?   Pain Edu? --   ?   Excl. in [place]? --   ? ?No data found. ? ?Updated Vital Signs ?BP (!) 171/75 (BP Location: Right Arm)   Pulse 77   Temp 98.3 ?F (36.8 ?C) (Oral)   Resp 16   Ht '5\' 5"'$  (1.651 m)   Wt 214 lb 1.1 oz (97.1 kg)   SpO2 99%   BMI 35.62 kg/m?  ? ?Visual Acuity ?Right Eye Distance:   ?Left Eye Distance:   ?Bilateral Distance:   ? ?Right Eye Near:   ?Left Eye Near:    ?Bilateral Near:    ? ?Physical Exam ?Vitals and nursing note reviewed.  ?Constitutional:   ?   General: She is awake. She is not in acute distress. ?    Appearance: She is well-developed and well-groomed. She is ill-appearing.  ?   Comments: She is sitting on the exam table in no acute distress but appears tired and ill.   ?HENT:  ?   Head: Normocephalic and atraumatic.  ?  Right Ear: Hearing, ear canal and external ear normal. No middle ear effusion. Tympanic membrane is bulging. Tympanic membrane is not injected or erythematous.  ?   Left Ear: Hearing, ear canal and external ear normal.  No middle ear effusion. Tympanic membrane is bulging. Tympanic membrane is not injected or erythematous.  ?   Nose: Congestion present.  ?   Right Sinus: Maxillary sinus tenderness and frontal sinus tenderness present.  ?   Left Sinus: Maxillary sinus tenderness and frontal sinus tenderness present.  ?Eyes:  ?   General: Vision grossly intact. No visual field deficit.    ?   Right eye: Discharge present.     ?   Left eye: Discharge present. ?   Extraocular Movements: Extraocular movements intact.  ?   Conjunctiva/sclera:  ?   Right eye: Right conjunctiva is injected. Chemosis and exudate present.  ?   Left eye: Left conjunctiva is injected. Chemosis and exudate present.  ?   Pupils: Pupils are equal, round, and reactive to light.  ?   Comments: Both upper and lower eyelids are red, swollen and slightly tender. Conjunctiva are both red, irritated with yellowish discharge.   ?Cardiovascular:  ?   Rate and Rhythm: Normal rate and regular rhythm.  ?   Heart sounds: Normal heart sounds. No murmur heard. ?Pulmonary:  ?   Effort: Pulmonary effort is normal. No respiratory distress.  ?   Breath sounds: Normal breath sounds and air entry. No decreased air movement. No decreased breath sounds, wheezing, rhonchi or rales.  ?Musculoskeletal:     ?   General: Normal range of motion.  ?   Cervical back: Normal range of motion and neck supple.  ?Lymphadenopathy:  ?   Cervical: No cervical adenopathy.  ?Skin: ?   General: Skin is warm and dry.  ?   Capillary Refill: Capillary refill takes less  than 2 seconds.  ?   Findings: No rash.  ?Neurological:  ?   General: No focal deficit present.  ?   Mental Status: She is alert and oriented to person, place, and time.  ?Psychiatric:     ?   Mood and Affect:

## 2021-09-07 NOTE — Discharge Instructions (Addendum)
Recommend start Augmentin '875mg'$  twice a day with food for 7 days. Continue to push fluids to help loosen up mucus in sinuses. May continue OTC cough medication as directed.  ?Start Vigamox eye drops- place 1 drop in each eye 3 times a day for 5 to 7 days. Wash hands frequently. Follow-up with your PCP in 3 to 4 days if not improving.  ?

## 2021-10-19 ENCOUNTER — Other Ambulatory Visit: Payer: Self-pay | Admitting: General Surgery

## 2021-10-19 NOTE — Progress Notes (Signed)
Subjective:     Patient ID: Carolyn Gillespie is a 81 y.o. female.   HPI   The following portions of the patient's history were reviewed and updated as appropriate.   This an established patient is here today for: office visit. The patient is here today to discuss having a colonoscopy. Patient was due in 2018-07-31 but had to put off due to Covid and multiple family deaths. Patient reports her last colonoscopy was done in 03-30-2014. The patient reports bowel movements once daily. She denies any rectal bleeding or mucus.    The patient's husband and 3 siblings have died since her last visit in 07-31-18.  Procedure at that time was postponed by insurance issues and then by other family responsibilities.        Chief Complaint  Patient presents with   Pre-op Exam      BP (!) 164/82   Pulse 75   Temp 36.8 C (98.2 F)   Ht 165.1 cm (5' 5" )   Wt 92.5 kg (204 lb)   SpO2 100%   BMI 33.95 kg/m        Past Medical History:  Diagnosis Date   Diabetes (CMS-HCC)     HLD (hyperlipidemia)     HTN (hypertension)             Past Surgical History:  Procedure Laterality Date   bladder tack   31-Jul-1998   COLONOSCOPY   03/30/2014    Dr Bary Castilla   ABDOMINAL HYSTERECTOMY       TONSILLECTOMY            OB History       Gravida  3   Para  3   Term      Preterm      AB      Living           SAB      IAB      Ectopic      Molar      Multiple      Live Births           Obstetric Comments  Age at first period 19 Age of first pregnancy 66               Social History          Socioeconomic History   Marital status: Widowed  Tobacco Use   Smoking status: Never   Smokeless tobacco: Never  Substance and Sexual Activity   Alcohol use: Not Currently   Drug use: Never        No Known Allergies   Current Medications        Current Outpatient Medications  Medication Sig Dispense Refill   amLODIPine (NORVASC) 10 MG tablet Take 10 mg by mouth once daily       aspirin  81 MG EC tablet Take 81 mg by mouth once daily       atorvastatin (LIPITOR) 40 MG tablet Take 40 mg by mouth every evening       dulaglutide (TRULICITY) 1.5 HQ/4.6 mL subcutaneous pen injector Inject 1.5 mg subcutaneously once a week       glimepiride (AMARYL) 2 MG tablet Take 2 mg by mouth 2 (two) times daily       levothyroxine (SYNTHROID) 50 MCG tablet Take 50 mcg by mouth once daily       metFORMIN (GLUCOPHAGE) 500 MG tablet Take 1,000 mg by mouth 2 (two) times daily  pioglitazone (ACTOS) 30 MG tablet Take 30 mg by mouth once daily        No current facility-administered medications for this visit.             Family History  Problem Relation Age of Onset   Diabetes Mother     Breast cancer Neg Hx          Labs and Radiology:    March 30, 2014 pathology:   A. ASCENDING COLON POLYP; HOT SNARE:  - VILLOUS ADENOMA, MULTIPLE FRAGMENTS.  - NEGATIVE FOR HIGH-GRADE DYSPLASIA AND MALIGNANCY.   COMMENT: Correlation with the clinical appearance is advised with regard  to completeness of removal.    GROSS DESCRIPTION:  A. Labeled: Hot snare polyp ascending colon  Tissue Fragment(s): Multiple  Measurement: Aggregate 1 x 0.4 x 0.2 cm    Procedure report:    8 mm  pedunculated polyp.    Oct 06, 2021 laboratory review:   Blood sugar 105, normal electrolytes, EGFR 92, creatinine 0.58.  Normal liver function studies.  Elevated serum albumin of 4.8.  Hemoglobin A1c 7.2.         Review of Systems  Constitutional:  Negative for chills and fever.  Respiratory:  Negative for cough.        Objective:   Physical Exam Exam conducted with a chaperone present.  Constitutional:      Appearance: Normal appearance.  Cardiovascular:     Rate and Rhythm: Normal rate and regular rhythm.     Pulses: Normal pulses.     Heart sounds: Normal heart sounds.  Pulmonary:     Effort: Pulmonary effort is normal.     Breath sounds: Normal breath sounds.  Musculoskeletal:      Cervical back: Neck supple.  Skin:    General: Skin is warm and dry.  Neurological:     Mental Status: She is alert and oriented to person, place, and time.  Psychiatric:        Mood and Affect: Mood normal.        Behavior: Behavior normal.         Assessment:     Candidate for repeat colonoscopy based on family history and prior villous adenoma of the ascending colon.    Plan:     Patient was instructed in regards to preparation by the staff.  Procedure scheduled for November 01, 2021.      This note is partially prepared by Ledell Noss, CMA acting as a scribe in the presence of Dr. Hervey Ard, MD.    The documentation recorded by the scribe accurately reflects the service I personally performed and the decisions made by me.    Robert Bellow, MD FACS

## 2021-11-01 ENCOUNTER — Ambulatory Visit: Payer: Medicare Other

## 2021-11-01 ENCOUNTER — Encounter
Admission: RE | Disposition: A | Discharge status: Home / Self Care | Payer: Self-pay | Source: Home / Self Care | Attending: General Surgery

## 2021-11-01 ENCOUNTER — Ambulatory Visit
Admission: RE | Admit: 2021-11-01 | Discharge: 2021-11-01 | Disposition: A | Discharge status: Home / Self Care | Payer: Medicare Other | Attending: General Surgery | Admitting: General Surgery

## 2021-11-01 ENCOUNTER — Encounter: Payer: Self-pay | Admitting: General Surgery

## 2021-11-01 DIAGNOSIS — Z1211 Encounter for screening for malignant neoplasm of colon: Secondary | ICD-10-CM | POA: Diagnosis present

## 2021-11-01 DIAGNOSIS — D123 Benign neoplasm of transverse colon: Secondary | ICD-10-CM | POA: Diagnosis not present

## 2021-11-01 DIAGNOSIS — E785 Hyperlipidemia, unspecified: Secondary | ICD-10-CM | POA: Diagnosis not present

## 2021-11-01 DIAGNOSIS — Z86010 Personal history of colonic polyps: Secondary | ICD-10-CM | POA: Insufficient documentation

## 2021-11-01 DIAGNOSIS — Z7984 Long term (current) use of oral hypoglycemic drugs: Secondary | ICD-10-CM | POA: Insufficient documentation

## 2021-11-01 DIAGNOSIS — I10 Essential (primary) hypertension: Secondary | ICD-10-CM | POA: Insufficient documentation

## 2021-11-01 DIAGNOSIS — E119 Type 2 diabetes mellitus without complications: Secondary | ICD-10-CM | POA: Diagnosis not present

## 2021-11-01 DIAGNOSIS — Z79899 Other long term (current) drug therapy: Secondary | ICD-10-CM | POA: Insufficient documentation

## 2021-11-01 DIAGNOSIS — Z800 Family history of malignant neoplasm of digestive organs: Secondary | ICD-10-CM | POA: Diagnosis not present

## 2021-11-01 LAB — GLUCOSE, CAPILLARY: Glucose-Capillary: 143 mg/dL — ABNORMAL HIGH (ref 70–99)

## 2021-11-01 SURGERY — COLONOSCOPY WITH PROPOFOL
Anesthesia: General

## 2021-11-01 MED ORDER — SODIUM CHLORIDE 0.9 % IV SOLN: INTRAVENOUS | Status: DC

## 2021-11-01 MED ORDER — PROPOFOL 500 MG/50ML IV EMUL
INTRAVENOUS | Status: DC | PRN
  Administered 2021-11-01: 150 ug/kg/min via INTRAVENOUS

## 2021-11-01 MED ORDER — LIDOCAINE HCL (PF) 2 % IJ SOLN
INTRAMUSCULAR | Status: AC
  Filled 2021-11-01: qty 10

## 2021-11-01 MED ORDER — PROPOFOL 500 MG/50ML IV EMUL
INTRAVENOUS | Status: AC
  Filled 2021-11-01: qty 100

## 2021-11-01 MED ORDER — PROPOFOL 10 MG/ML IV BOLUS
INTRAVENOUS | Status: DC | PRN
  Administered 2021-11-01: 40 mg via INTRAVENOUS
  Administered 2021-11-01: 60 mg via INTRAVENOUS

## 2021-11-01 MED ORDER — LIDOCAINE HCL (CARDIAC) PF 100 MG/5ML IV SOSY
PREFILLED_SYRINGE | INTRAVENOUS | Status: DC | PRN
  Administered 2021-11-01: 50 mg via INTRAVENOUS

## 2021-11-01 NOTE — H&P (Signed)
Carolyn Gillespie [id number] 02/16/1941     HPI:  Villous adenoma of the ascending colon in 2015. For follow up exam. Tolerated the prep with minimal nausea, no vomiting.   Medications Prior to Admission  Medication Sig Dispense Refill Last Dose   amLODipine (NORVASC) 10 MG tablet Take 10 mg by mouth daily.   10/31/2021   Dulaglutide (TRULICITY [place]) Inject into the skin.   10/29/2021   glimepiride (AMARYL) 2 MG tablet Take 2 mg by mouth daily with breakfast.    10/31/2021   pravastatin (PRAVACHOL) 40 MG tablet Take 40 mg by mouth daily.    10/31/2021   aspirin EC 81 MG tablet Take 81 mg by mouth daily. Swallow whole.      ibuprofen (ADVIL,MOTRIN) 800 MG tablet 800 mg every 8 (eight) hours as needed.    10/29/2021   LEVOTHYROXINE SODIUM PO Take by mouth.      LORazepam (ATIVAN) 0.5 MG tablet Take 0.5 mg by mouth 2 (two) times daily.    10/29/2021   metFORMIN (GLUCOPHAGE) 1000 MG tablet Take 1,000 mg by mouth 2 (two) times daily with a meal.    10/30/2021   moxifloxacin (VIGAMOX) 0.5 % ophthalmic solution Place 1 drop into both eyes 3 (three) times daily. 3 mL 0    pioglitazone (ACTOS) 30 MG tablet Take 30 mg by mouth daily.   10/30/2021   polyethylene glycol powder (GLYCOLAX/MIRALAX) 17 GM/SCOOP powder Take 1 Container by mouth once. 255 grams one bottle for colonoscopy prep      No Known Allergies Past Medical History:  Diagnosis Date   Diabetes mellitus without complication ([place])    Hyperlipidemia    Hypertension    Past Surgical History:  Procedure Laterality Date   ABDOMINAL HYSTERECTOMY     1970's   bladder tack  2000   COLONOSCOPY     20 years ago   TONSILLECTOMY     Social History   Socioeconomic History   Marital status: Widowed    Spouse name: Not on file   Number of children: Not on file   Years of education: Not on file   Highest education level: Not on file  Occupational History   Not on file  Tobacco Use   Smoking status: Never   Smokeless tobacco: Never  Vaping Use    Vaping Use: Never used  Substance and Sexual Activity   Alcohol use: No    Alcohol/week: 0.0 standard drinks of alcohol   Drug use: No   Sexual activity: Not on file  Other Topics Concern   Not on file  Social History Narrative   Not on file   Social Determinants of Health   Financial Resource Strain: Not on file  Food Insecurity: Not on file  Transportation Needs: Not on file  Physical Activity: Not on file  Stress: Not on file  Social Connections: Not on file  Intimate Partner Violence: Not on file   Social History   Social History Narrative   Not on file     ROS: Negative.     PE: HEENT: Negative. Lungs: Clear. Cardio: RR.    Assessment/Plan:  Proceed with planned endoscopy.  Forest Gleason [facility] 11/01/2021

## 2021-11-01 NOTE — Anesthesia Postprocedure Evaluation (Signed)
Anesthesia Post Note  Patient: Carolyn Gillespie  Procedure(s) Performed: COLONOSCOPY WITH PROPOFOL  Patient location during evaluation: PACU Anesthesia Type: General Level of consciousness: awake and alert Pain management: pain level controlled Vital Signs Assessment: post-procedure vital signs reviewed and stable Respiratory status: spontaneous breathing, nonlabored ventilation, respiratory function stable and patient connected to nasal cannula oxygen Cardiovascular status: blood pressure returned to baseline and stable Postop Assessment: no apparent nausea or vomiting Anesthetic complications: no   No notable events documented.   Last Vitals:  Vitals:   11/01/21 0815 11/01/21 0825  BP: (!) 104/41 (!) 127/59  Pulse: 64   Resp:    Temp: (!) 35.8 C   SpO2: 100%     Last Pain:  Vitals:   11/01/21 0825  TempSrc:   PainSc: 0-No pain                 Molli Barrows

## 2021-11-01 NOTE — Anesthesia Preprocedure Evaluation (Addendum)
Anesthesia Evaluation  Patient identified by MRN, date of birth, ID band Patient awake    Reviewed: Allergy & Precautions, H&P , NPO status , Patient's Chart, lab work & pertinent test results, reviewed documented beta blocker date and time   Airway Mallampati: II   Neck ROM: full    Dental  (+) Poor Dentition   Pulmonary neg pulmonary ROS,    Pulmonary exam normal        Cardiovascular Exercise Tolerance: Good hypertension, On Medications negative cardio ROS Normal cardiovascular exam Rhythm:regular Rate:Normal     Neuro/Psych negative neurological ROS  negative psych ROS   GI/Hepatic negative GI ROS, Neg liver ROS,   Endo/Other  negative endocrine ROSdiabetes, Well Controlled, Type 2, Oral Hypoglycemic Agents  Renal/GU negative Renal ROS  negative genitourinary   Musculoskeletal   Abdominal   Peds  Hematology negative hematology ROS (+)   Anesthesia Other Findings Past Medical History: No date: Diabetes mellitus without complication (HCC) No date: Hyperlipidemia No date: Hypertension Past Surgical History: No date: ABDOMINAL HYSTERECTOMY     Comment:  1970's 2000: bladder tack No date: COLONOSCOPY     Comment:  20 years ago No date: TONSILLECTOMY   Reproductive/Obstetrics negative OB ROS                            Anesthesia Physical Anesthesia Plan  ASA: 2  Anesthesia Plan: General   Post-op Pain Management:    Induction:   PONV Risk Score and Plan:   Airway Management Planned:   Additional Equipment:   Intra-op Plan:   Post-operative Plan:   Informed Consent: I have reviewed the patients History and Physical, chart, labs and discussed the procedure including the risks, benefits and alternatives for the proposed anesthesia with the patient or authorized representative who has indicated his/her understanding and acceptance.     Dental Advisory Given  Plan  Discussed with: CRNA  Anesthesia Plan Comments:        Anesthesia Quick Evaluation

## 2021-11-01 NOTE — Transfer of Care (Signed)
Immediate Anesthesia Transfer of Care Note  Patient: Carolyn Gillespie  Procedure(s) Performed: COLONOSCOPY WITH PROPOFOL  Patient Location: PACU and Endoscopy Unit  Anesthesia Type:MAC  Level of Consciousness: drowsy  Airway & Oxygen Therapy: Patient Spontanous Breathing and Patient connected to nasal cannula oxygen  Post-op Assessment: Report given to RN and Post -op Vital signs reviewed and stable  Post vital signs: Reviewed and stable  Last Vitals:  Vitals Value Taken Time  BP 104/41 11/01/21 0815  Temp    Pulse 63 11/01/21 0815  Resp 14 11/01/21 0815  SpO2 100 % 11/01/21 0815  Vitals shown include unvalidated device data.  Last Pain:  Vitals:   11/01/21 0708  TempSrc: Temporal  PainSc: 0-No pain         Complications: No notable events documented.

## 2021-11-01 NOTE — Op Note (Signed)
[facility] Patient Name: Carolyn Gillespie Procedure Date: 11/01/2021 7:28 AM MRN: [id number] Account #: [id number] Date of Birth: October 30, 1940 Admit Type: Outpatient Age: 81 Room: [facility] ENDO ROOM 1 Gender: Female Note Status: Finalized Instrument Name: Peds Colonoscope [id number] Procedure:             Colonoscopy Indications:           High risk colon cancer surveillance: Personal history                         of colonic polyps Providers:             Robert Bellow, MD Referring MD:          Leona Carry. Hall Busing, MD (Referring MD) Medicines:             Propofol per Anesthesia Complications:         No immediate complications. Procedure:             Pre-Anesthesia Assessment:                        - Prior to the procedure, a History and Physical was                         performed, and patient medications, allergies and                         sensitivities were reviewed. The patient's tolerance                         of previous anesthesia was reviewed.                        - The risks and benefits of the procedure and the                         sedation options and risks were discussed with the                         patient. All questions were answered and informed                         consent was obtained.                        After obtaining informed consent, the colonoscope was                         passed under direct vision. Throughout the procedure,                         the patient's blood pressure, pulse, and oxygen                         saturations were monitored continuously. The                         Colonoscope was introduced through the anus and  advanced to the the cecum, identified by appendiceal                         orifice and ileocecal valve. The colonoscopy was                         somewhat difficult due to a redundant colon and                         significant looping. Successful  completion of the                         procedure was aided by changing the patient to a prone                         position. The quality of the bowel preparation was                         excellent. The patient tolerated the procedure well. Findings:      A 12 mm polyp was found in the hepatic flexure. The polyp was       semi-pedunculated. The polyp was removed with a hot snare. Resection and       retrieval were complete.      The retroflexed view of the distal rectum and anal verge was normal and       showed no anal or rectal abnormalities. Impression:            - One 12 mm polyp at the hepatic flexure, removed with                         a hot snare. Resected and retrieved.                        - The distal rectum and anal verge are normal on                         retroflexion view. Recommendation:        - Telephone endoscopist for pathology results in 1                         week. Procedure Code(s):     --- Professional ---                        [phone], Colonoscopy, flexible; with removal of                         tumor(s), polyp(s), or other lesion(s) by snare                         technique Diagnosis Code(s):     --- Professional ---                        Z86.010, Personal history of colonic polyps                        K63.5, Polyp of colon CPT copyright 2019 [employer]. All rights reserved. The codes documented in this  report are preliminary and upon coder review may  be revised to meet current compliance requirements. Robert Bellow, MD 11/01/2021 8:13:07 AM This report has been signed electronically. Number of Addenda: 0 Note Initiated On: 11/01/2021 7:28 AM Scope Withdrawal Time: 0 hours 14 minutes 30 seconds  Total Procedure Duration: 0 hours 33 minutes 48 seconds  Estimated Blood Loss:  Estimated blood loss: none.      [facility]

## 2021-11-02 ENCOUNTER — Encounter: Payer: Self-pay | Admitting: General Surgery

## 2021-11-02 LAB — SURGICAL PATHOLOGY

## 2023-02-11 ENCOUNTER — Other Ambulatory Visit: Payer: Self-pay | Admitting: Orthopedic Surgery

## 2023-02-11 DIAGNOSIS — M4802 Spinal stenosis, cervical region: Secondary | ICD-10-CM

## 2023-02-21 ENCOUNTER — Other Ambulatory Visit: Payer: Self-pay | Admitting: Internal Medicine

## 2023-02-21 DIAGNOSIS — Z1231 Encounter for screening mammogram for malignant neoplasm of breast: Secondary | ICD-10-CM

## 2023-02-25 ENCOUNTER — Other Ambulatory Visit: Payer: Medicare Other

## 2023-02-28 ENCOUNTER — Ambulatory Visit
Admission: RE | Admit: 2023-02-28 | Discharge: 2023-02-28 | Disposition: A | Discharge status: Home / Self Care | Payer: Medicare Other | Source: Ambulatory Visit | Attending: Orthopedic Surgery | Admitting: Orthopedic Surgery

## 2023-02-28 DIAGNOSIS — M4802 Spinal stenosis, cervical region: Secondary | ICD-10-CM

## 2023-03-07 ENCOUNTER — Ambulatory Visit
Admission: RE | Admit: 2023-03-07 | Discharge: 2023-03-07 | Disposition: A | Discharge status: Home / Self Care | Payer: Medicare Other | Source: Ambulatory Visit | Attending: Internal Medicine | Admitting: Internal Medicine

## 2023-03-07 ENCOUNTER — Ambulatory Visit: Payer: Medicare Other

## 2023-03-07 DIAGNOSIS — Z1231 Encounter for screening mammogram for malignant neoplasm of breast: Secondary | ICD-10-CM | POA: Diagnosis present

## 2023-07-13 ENCOUNTER — Ambulatory Visit
Admission: EM | Admit: 2023-07-13 | Discharge: 2023-07-13 | Disposition: A | Discharge status: Home / Self Care | Attending: Physician Assistant | Admitting: Physician Assistant

## 2023-07-13 ENCOUNTER — Encounter: Payer: Self-pay | Admitting: Emergency Medicine

## 2023-07-13 DIAGNOSIS — R0981 Nasal congestion: Secondary | ICD-10-CM | POA: Insufficient documentation

## 2023-07-13 DIAGNOSIS — R051 Acute cough: Secondary | ICD-10-CM | POA: Insufficient documentation

## 2023-07-13 DIAGNOSIS — I10 Essential (primary) hypertension: Secondary | ICD-10-CM | POA: Insufficient documentation

## 2023-07-13 DIAGNOSIS — U071 COVID-19: Secondary | ICD-10-CM | POA: Diagnosis present

## 2023-07-13 LAB — GROUP A STREP BY PCR: Group A Strep by PCR: NOT DETECTED

## 2023-07-13 LAB — RESP PANEL BY RT-PCR (RSV, FLU A&B, COVID)  RVPGX2
Influenza A by PCR: NEGATIVE
Influenza B by PCR: NEGATIVE
Resp Syncytial Virus by PCR: NEGATIVE
SARS Coronavirus 2 by RT PCR: POSITIVE — AB

## 2023-07-13 MED ORDER — PSEUDOEPH-BROMPHEN-DM 30-2-10 MG/5ML PO SYRP: 10.0000 mL | ORAL_SOLUTION | Freq: Four times a day (QID) | ORAL | 0 refills | Status: AC | PRN

## 2023-07-13 MED ORDER — IPRATROPIUM BROMIDE 0.06 % NA SOLN: 2.0000 | Freq: Four times a day (QID) | NASAL | 0 refills | Status: AC

## 2023-07-13 NOTE — ED Triage Notes (Signed)
 Patient c/o cough, chest congestion, and nasal congestion since Thursday.  Patient denies fevers.

## 2023-07-13 NOTE — Discharge Instructions (Addendum)
-  You are positive for COVID 19. Isolate 5 days from symptom onset and wear a mask x 5 days.  -You declined antiviral medication today. - I sent cough medicine and nasal spray. - Symptoms related to COVID can last a couple weeks. - You need to be seen again if you develop a fever, cough or breathing problem.

## 2023-07-13 NOTE — ED Provider Notes (Signed)
 [facility]    CSN: [id number] Arrival date & time: 07/13/23  0830      History   Chief Complaint Chief Complaint  Patient presents with   Cough   Nasal Congestion    HPI AMIREE NO is a 83 y.o. female presenting for cough, congestion and sore throat that began about 2-1/2 days ago.  Denies fever, fatigue, headaches, body aches, ear pain, sinus pain, chest pain, wheezing or shortness of breath, abdominal pain, vomiting or diarrhea.  Denies any sick contacts.  Has tried Mucinex and nasal saline without relief.  Medical history significant for diabetes, hypertension and hyperlipidemia.  Patient reports blood pressure is always high in the doctor's office.  Initial reading is 170/65.  Reports she just took her medicine this morning.  States blood pressures are fine at home.  HPI  Past Medical History:  Diagnosis Date   Diabetes mellitus without complication (HCC)    Hyperlipidemia    Hypertension     Patient Active Problem List   Diagnosis Date Noted   History of colonic polyps 11/20/2018    Past Surgical History:  Procedure Laterality Date   ABDOMINAL HYSTERECTOMY     1970's   bladder tack  2000   COLONOSCOPY     20 years ago   COLONOSCOPY WITH PROPOFOL N/A 11/01/2021   Procedure: COLONOSCOPY WITH PROPOFOL;  Surgeon: Earline Mayotte, MD;  Location: [facility] ENDOSCOPY;  Service: Endoscopy;  Laterality: N/A;   TONSILLECTOMY      OB History     Gravida  2   Para  2   Term      Preterm      AB      Living  2      SAB      IAB      Ectopic      Multiple      Live Births           Obstetric Comments  1st Menstrual Cycle:  15  1st Pregnancy:  19          Home Medications    Prior to Admission medications   Medication Sig Start Date End Date Taking? Authorizing Provider  brompheniramine-pseudoephedrine-DM 30-2-10 MG/5ML syrup Take 10 mLs by mouth 4 (four) times daily as needed for up to 7 days. 07/13/23 07/20/23 Yes Eusebio Friendly  B, PA-C  ipratropium (ATROVENT) 0.06 % nasal spray Place 2 sprays into both nostrils 4 (four) times daily. 07/13/23  Yes Eusebio Friendly B, PA-C  amLODipine (NORVASC) 10 MG tablet Take 10 mg by mouth daily.    [provider]  aspirin EC 81 MG tablet Take 81 mg by mouth daily. Swallow whole.    [provider]  Dulaglutide (TRULICITY Delevan) Inject into the skin.    [provider]  glimepiride (AMARYL) 2 MG tablet Take 2 mg by mouth daily with breakfast.  02/03/14   [provider]  ibuprofen (ADVIL,MOTRIN) 800 MG tablet 800 mg every 8 (eight) hours as needed.  02/03/14   [provider]  LEVOTHYROXINE SODIUM PO Take by mouth.    [provider]  LORazepam (ATIVAN) 0.5 MG tablet Take 0.5 mg by mouth 2 (two) times daily.  02/03/14   [provider]  metFORMIN (GLUCOPHAGE) 1000 MG tablet Take 1,000 mg by mouth 2 (two) times daily with a meal.  02/03/14   [provider]  moxifloxacin (VIGAMOX) 0.5 % ophthalmic solution Place 1 drop into both eyes 3 (  three) times daily. 09/07/21   Sudie Grumbling, NP  pioglitazone (ACTOS) 30 MG tablet Take 30 mg by mouth daily.    [provider]  polyethylene glycol powder (GLYCOLAX/MIRALAX) 17 GM/SCOOP powder Take 1 Container by mouth once. 255 grams one bottle for colonoscopy prep    [provider]  pravastatin (PRAVACHOL) 40 MG tablet Take 40 mg by mouth daily.  02/03/14   [provider]    Family History Family History  Problem Relation Age of Onset   Diabetes Mother    Breast cancer Paternal Aunt     Social History Social History   Tobacco Use   Smoking status: Never   Smokeless tobacco: Never  Vaping Use   Vaping status: Never Used  Substance Use Topics   Alcohol use: No    Alcohol/week: 0.0 standard drinks of alcohol   Drug use: No     Allergies   Patient has no known allergies.   Review of Systems Review of Systems  Constitutional:  Positive for  fatigue. Negative for chills, diaphoresis and fever.  HENT:  Positive for congestion, rhinorrhea and sore throat. Negative for ear pain, sinus pressure and sinus pain.   Respiratory:  Positive for cough. Negative for shortness of breath.   Cardiovascular:  Negative for chest pain.  Gastrointestinal:  Negative for abdominal pain, nausea and vomiting.  Musculoskeletal:  Negative for arthralgias and myalgias.  Skin:  Negative for rash.  Neurological:  Negative for weakness and headaches.  Hematological:  Negative for adenopathy.     Physical Exam Triage Vital Signs ED Triage Vitals  Encounter Vitals Group     BP 07/13/23 0858 (!) 170/65     Systolic BP Percentile --      Diastolic BP Percentile --      Pulse Rate 07/13/23 0858 74     Resp 07/13/23 0858 14     Temp 07/13/23 0858 98.1 F (36.7 C)     Temp Source 07/13/23 0858 Oral     SpO2 07/13/23 0858 98 %     Weight 07/13/23 0856 203 lb 14.8 oz (92.5 kg)     Height 07/13/23 0856 5\' 5"  (1.651 m)     Head Circumference --      Peak Flow --      Pain Score 07/13/23 0856 0     Pain Loc --      Pain Education --      Exclude from Growth Chart --    No data found.  Updated Vital Signs BP (!) 164/74 (BP Location: Left Arm)   Pulse 70   Temp 98.1 F (36.7 C) (Oral)   Resp 14   Ht 5\' 5"  (1.651 m)   Wt 203 lb 14.8 oz (92.5 kg)   SpO2 95%   BMI 33.93 kg/m       Physical Exam Vitals and nursing note reviewed.  Constitutional:      General: She is not in acute distress.    Appearance: Normal appearance. She is not ill-appearing or toxic-appearing.  HENT:     Head: Normocephalic and atraumatic.     Right Ear: Tympanic membrane, ear canal and external ear normal.     Left Ear: Tympanic membrane, ear canal and external ear normal.     Nose: Congestion present.     Mouth/Throat:     Mouth: Mucous membranes are moist.     Pharynx: Oropharynx is clear. Posterior oropharyngeal erythema present.  Eyes:     General:  No scleral  icterus.       Right eye: No discharge.        Left eye: No discharge.     Conjunctiva/sclera: Conjunctivae normal.  Cardiovascular:     Rate and Rhythm: Normal rate and regular rhythm.     Heart sounds: Normal heart sounds.  Pulmonary:     Effort: Pulmonary effort is normal. No respiratory distress.     Breath sounds: Normal breath sounds.  Musculoskeletal:     Cervical back: Neck supple.  Skin:    General: Skin is dry.  Neurological:     General: No focal deficit present.     Mental Status: She is alert. Mental status is at baseline.     Motor: No weakness.     Gait: Gait normal.  Psychiatric:        Mood and Affect: Mood normal.        Behavior: Behavior normal.      UC Treatments / Results  Labs (all labs ordered are listed, but only abnormal results are displayed) Labs Reviewed  RESP PANEL BY RT-PCR (RSV, FLU A&B, COVID)  [id number] - Abnormal; Notable for the following components:      Result Value   SARS Coronavirus 2 by RT PCR POSITIVE (*)    All other components within normal limits  GROUP A STREP BY PCR    EKG   Radiology No results found.  Procedures Procedures (including critical care time)  Medications Ordered in UC Medications - No data to display  Initial Impression / Assessment and Plan / UC Course  I have reviewed the triage vital signs and the nursing notes.  Pertinent labs & imaging results that were available during my care of the patient were reviewed by me and considered in my medical decision making (see chart for details).   83 year old female with history of diabetes, hypertension and hyperlipidemia presents for cough, congestion/runny nose and sore throat for 2 and half days.  States symptoms started on Thursday evening.  Denies fever, headaches, body aches, sinus pain, chest pain, shortness of breath.  No sick contacts.  Tried Mucinex and nasal saline without relief.  Blood pressure 178/65.  Repeat reading 164/74.  Patient is on  amlodipine for hypertension.  Advised to keep a log of blood pressure at home and follow-up with PCP if consistently over 140/90.  On exam she is overall well-appearing.  No acute distress.  Ears are clear.  Throat with slight erythema and mild nasal congestion without drainage.  Chest clear.  Heart regular rate and rhythm.  Respiratory panel and strep testing obtained.  Positive COVID.  Reviewed current CDC guidelines, isolation protocol and ED precautions.  Patient has not had a fever so we will go with the old guidelines of isolation 5 days and wear a mask 5 days.  When I reviewed that patient tested positive for COVID she yelled at me and stated "It is not possible.  I have not been anywhere.  I've been around a baby.  I am going to church tomorrow.  You cannot make me isolate."  Explained to patient that I was just reviewing the guidelines with her.  She declines antiviral medication.  Offered her cough medicine and nasal spray which was sent to the pharmacy.  Encouraged increasing rest and fluids.  Thoroughly reviewed return and ED precautions.   Final Clinical Impressions(s) / UC Diagnoses   Final diagnoses:  COVID-19  Acute cough  Nasal congestion  Essential hypertension  Discharge Instructions      -You are positive for COVID 19. Isolate 5 days from symptom onset and wear a mask x 5 days.  -You declined antiviral medication today. - I sent cough medicine and nasal spray. - Symptoms related to COVID can last a couple weeks. - You need to be seen again if you develop a fever, cough or breathing problem.     ED Prescriptions     Medication Sig Dispense Auth. Provider   brompheniramine-pseudoephedrine-DM 30-2-10 MG/5ML syrup Take 10 mLs by mouth 4 (four) times daily as needed for up to 7 days. 150 mL Eusebio Friendly B, PA-C   ipratropium (ATROVENT) 0.06 % nasal spray Place 2 sprays into both nostrils 4 (four) times daily. 15 mL Shirlee Latch, PA-C      PDMP not reviewed  this encounter.   Shirlee Latch, PA-C 07/13/23 1019

## 2023-07-14 ENCOUNTER — Ambulatory Visit: Payer: Self-pay

## 2024-02-21 ENCOUNTER — Other Ambulatory Visit: Payer: Self-pay | Admitting: Family Medicine

## 2024-02-21 DIAGNOSIS — Z1231 Encounter for screening mammogram for malignant neoplasm of breast: Secondary | ICD-10-CM

## 2024-02-21 DIAGNOSIS — E2839 Other primary ovarian failure: Secondary | ICD-10-CM

## 2024-06-25 ENCOUNTER — Ambulatory Visit
